# Patient Record
Sex: Female | Born: 2000 | Race: White | Hispanic: No | Marital: Single | Smoking: Never smoker
Health system: Southern US, Community
[De-identification: ages and names within clinical notes are randomized; demographics above are authoritative.]

## PROBLEM LIST (undated history)

## (undated) DIAGNOSIS — R87629 Unspecified abnormal cytological findings in specimens from vagina: Secondary | ICD-10-CM

## (undated) DIAGNOSIS — J45909 Unspecified asthma, uncomplicated: Secondary | ICD-10-CM

## (undated) HISTORY — DX: Unspecified abnormal cytological findings in specimens from vagina: R87.629

## (undated) HISTORY — PX: OTHER SURGICAL HISTORY: SHX169

---
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
## 2018-06-25 ENCOUNTER — Encounter: Payer: Self-pay | Admitting: Nurse Practitioner

## 2018-07-10 ENCOUNTER — Other Ambulatory Visit (HOSPITAL_COMMUNITY)
Admission: RE | Admit: 2018-07-10 | Discharge: 2018-07-10 | Disposition: A | Discharge status: Home / Self Care | Payer: Medicaid Other | Source: Ambulatory Visit | Attending: Family Medicine | Admitting: Family Medicine

## 2018-07-10 ENCOUNTER — Encounter: Payer: Self-pay | Admitting: Family Medicine

## 2018-07-10 ENCOUNTER — Ambulatory Visit (INDEPENDENT_AMBULATORY_CARE_PROVIDER_SITE_OTHER): Payer: Medicaid Other | Admitting: Family Medicine

## 2018-07-10 VITALS — BP 111/68 | HR 65 | Ht 63.0 in | Wt 121.0 lb

## 2018-07-10 DIAGNOSIS — Z113 Encounter for screening for infections with a predominantly sexual mode of transmission: Secondary | ICD-10-CM | POA: Insufficient documentation

## 2018-07-10 DIAGNOSIS — Z308 Encounter for other contraceptive management: Secondary | ICD-10-CM

## 2018-07-10 NOTE — Patient Instructions (Signed)

## 2018-07-10 NOTE — Progress Notes (Signed)
   Subjective:    Patient ID: [name] is a 17 y.o. female presenting with Establish Care  on 07/10/2018  HPI: Here today to discuss contraception options. Currently sexually active and using condoms regularly. Going into the [employer] in January and wants better contraception. LMP 06/26/18. No unprotected intercourse. No recent STD screen.  The following portions of the history were reviewed and updated: PMH, PSH, allergies, meds, FH, SH  Review of Systems  Constitutional: Negative for chills and fever.  Respiratory: Negative for shortness of breath.   Cardiovascular: Negative for chest pain.  Gastrointestinal: Negative for abdominal pain, nausea and vomiting.  Genitourinary: Negative for dysuria.  Skin: Negative for rash.      Objective:    BP 111/68   Pulse 65   Ht 5\' 3"  (1.6 m)   Wt 121 lb (54.9 kg)   LMP 06/30/2018 (Approximate)   BMI 21.43 kg/m  Physical Exam  Constitutional: She is oriented to person, place, and time. She appears well-developed and well-nourished. No distress.  HENT:  Head: Normocephalic and atraumatic.  Eyes: No scleral icterus.  Neck: Neck supple.  Cardiovascular: Normal rate.  Pulmonary/Chest: Effort normal.  Abdominal: Soft.  Neurological: She is alert and oriented to person, place, and time.  Skin: Skin is warm and dry.  Psychiatric: She has a normal mood and affect.        Assessment & Plan:  Screen for STD (sexually transmitted disease) - Plan: Cervicovaginal ancillary only  Encounter for other contraceptive management - reviewed R/B/I of all including Depo, OC's, Nexplanon and IUD both prog and copper. She desires Kyleena--will check GC/Chlam, return with cycle, take Ibuprofen   Total face-to-face time with patient: 20 minutes. Over 50% of encounter was spent on counseling and coordination of care. Return in about 2 weeks (around 07/24/2018) for [facility] IUD insertion.  Donnamae Jude 07/10/2018 2:32 PM

## 2018-07-11 LAB — CERVICOVAGINAL ANCILLARY ONLY
CHLAMYDIA, DNA PROBE: NEGATIVE
Neisseria Gonorrhea: NEGATIVE

## 2018-08-05 ENCOUNTER — Other Ambulatory Visit (HOSPITAL_COMMUNITY)
Admission: RE | Admit: 2018-08-05 | Discharge: 2018-08-05 | Disposition: A | Discharge status: Home / Self Care | Payer: Medicaid Other | Source: Ambulatory Visit

## 2018-08-05 ENCOUNTER — Ambulatory Visit (INDEPENDENT_AMBULATORY_CARE_PROVIDER_SITE_OTHER): Payer: Medicaid Other

## 2018-08-05 VITALS — BP 111/71 | HR 60 | Ht 63.0 in | Wt 117.6 lb

## 2018-08-05 DIAGNOSIS — Z3202 Encounter for pregnancy test, result negative: Secondary | ICD-10-CM | POA: Diagnosis not present

## 2018-08-05 DIAGNOSIS — Z975 Presence of (intrauterine) contraceptive device: Secondary | ICD-10-CM

## 2018-08-05 DIAGNOSIS — Z30430 Encounter for insertion of intrauterine contraceptive device: Secondary | ICD-10-CM | POA: Diagnosis not present

## 2018-08-05 LAB — POCT URINE PREGNANCY: PREG TEST UR: NEGATIVE

## 2018-08-05 MED ORDER — LEVONORGESTREL 19.5 MG IU IUD
1.0000 | INTRAUTERINE_SYSTEM | Freq: Once | INTRAUTERINE | Status: AC
  Administered 2018-08-05: 19.5 mg via INTRAUTERINE

## 2018-08-05 NOTE — Progress Notes (Signed)
    GYNECOLOGY OFFICE PROCEDURE NOTE  [name] is a 18 y.o. G0P0000 here for [place] IUD insertion. No GYN concerns.  LMP 8/28 and no intercourse in last 14 days.   IUD Insertion Procedure Note Patient identified, informed consent performed, consent signed.   Discussed risks of irregular bleeding, cramping, infection, malpositioning or misplacement of the IUD outside the uterus which may require further procedure such as laparoscopy. Time out was performed.  Urine pregnancy test negative.  Speculum placed in the vagina.  Cervix visualized.  Cleaned with Betadine x 2.  Grasped anteriorly with a single tooth tenaculum.  Uterus sounded to 7 cm.  Liletta IUD placed per manufacturer's recommendations.  Strings trimmed to 3 cm. Tenaculum was removed, good hemostasis noted.  Patient tolerated procedure well.   When patient sat up to get dressed, patient felt dizzy and lightheaded. Patient laid back down in bed and given water and orange juice to drink. Patient reported feeling better and was able to get dressed unassisted and ambulate on her own. Suspected vagal response after IUD insertion.  Patient was given post-procedure instructions.  She was advised to have backup contraception for one week.  Patient was also asked to check IUD strings periodically and follow up in 4 weeks for IUD check.  Wende Mott, CNM 08/05/18 2:24 PM

## 2018-08-05 NOTE — Progress Notes (Signed)
RGYN presents for IUD Insertion.  LMP: 07/24/2018  Pt denies any unprotected intercourse x 14 days.   GC/CT per notes due today.   UPT: NEGATIVE

## 2018-08-06 LAB — CERVICOVAGINAL ANCILLARY ONLY
Chlamydia: NEGATIVE
Neisseria Gonorrhea: NEGATIVE
Trichomonas: NEGATIVE

## 2018-08-29 ENCOUNTER — Ambulatory Visit: Payer: Medicaid Other | Admitting: Certified Nurse Midwife

## 2018-08-29 ENCOUNTER — Telehealth: Payer: Self-pay | Admitting: Certified Nurse Midwife

## 2018-08-29 NOTE — Telephone Encounter (Signed)
Pt called back for appt. Gave pt appt at 4:20 today with Darrol Poke.

## 2018-08-29 NOTE — Telephone Encounter (Signed)
Called pt she declined appt today at 4:20pm. Pt states she will not have transportation at that time. Pt advised per triage in office to go to [facility] if symptoms worsen or do not resolve.

## 2018-08-29 NOTE — Telephone Encounter (Signed)
Pain and cramps worsening since IUD placement. Pt has appt Monday 09/02/18 but wants seen today if possible. She has called out of work due to pain. Call mother.

## 2018-09-02 ENCOUNTER — Ambulatory Visit (INDEPENDENT_AMBULATORY_CARE_PROVIDER_SITE_OTHER): Payer: Medicaid Other | Admitting: Advanced Practice Midwife

## 2018-09-02 VITALS — BP 116/74 | HR 119 | Wt 117.0 lb

## 2018-09-02 DIAGNOSIS — Z975 Presence of (intrauterine) contraceptive device: Secondary | ICD-10-CM | POA: Diagnosis not present

## 2018-09-02 DIAGNOSIS — N921 Excessive and frequent menstruation with irregular cycle: Secondary | ICD-10-CM

## 2018-09-02 DIAGNOSIS — Z30431 Encounter for routine checking of intrauterine contraceptive device: Secondary | ICD-10-CM | POA: Diagnosis not present

## 2018-09-02 MED ORDER — NORGESTIMATE-ETH ESTRADIOL 0.25-35 MG-MCG PO TABS: 1.0000 | ORAL_TABLET | Freq: Every day | ORAL | 3 refills | Status: DC

## 2018-09-02 NOTE — Patient Instructions (Signed)

## 2018-09-02 NOTE — Progress Notes (Signed)
  GYNECOLOGY CLINIC PROGRESS NOTE  History:  17 y.o. G0P0000 here today for today for IUD string check; Liletta IUD was placed  08/05/18. Pt with almost daily spotting since insertion, associated with mild cramping.   The following portions of the patient's history were reviewed and updated as appropriate: allergies, current medications, past family history, past medical history, past social history, past surgical history and problem list.   Review of Systems:  Pertinent items are noted in HPI.   Objective:  Physical Exam Blood pressure 116/74, pulse (!) 119, weight 53.1 kg. Gen: NAD Abd: Soft, nontender and nondistended Pelvic: Normal appearing external genitalia; normal appearing vaginal mucosa and cervix.  IUD strings visualized, about 4 cm in length outside cervix.   Assessment & Plan:  1. IUD check up --Doing well, IUD in place without complication. --Light, frequent bleeding reported by pt  2. Breakthrough bleeding associated with intrauterine device (IUD) --Discussed options, including expectant management as this will likely improve, use of OCPs, and removal of IUD and new contraceptive method.  Pt desires OCPs today.  Low-risk, personal or family hx DVT/PE.  Pt given warning signs/reasons to seek care. --F/U in 2 months in office - norgestimate-ethinyl estradiol (ORTHO-CYCLEN,SPRINTEC,PREVIFEM) 0.25-35 MG-MCG tablet; Take 1 tablet by mouth daily.  Dispense: 1 Package; Refill: 3  Patient to keep IUD in place for five years; can come in for removal if she desires pregnancy within the next five years. Routine preventative health maintenance measures emphasized.  Fatima Blank, CNM 2:36 PM

## 2018-09-24 ENCOUNTER — Telehealth: Payer: Self-pay | Admitting: Licensed Clinical Social Worker

## 2018-09-24 NOTE — Telephone Encounter (Signed)
Voicemail reminder of appt

## 2018-09-26 ENCOUNTER — Encounter: Payer: Self-pay | Admitting: Certified Nurse Midwife

## 2018-09-26 ENCOUNTER — Ambulatory Visit (INDEPENDENT_AMBULATORY_CARE_PROVIDER_SITE_OTHER): Payer: Medicaid Other | Admitting: Certified Nurse Midwife

## 2018-09-26 VITALS — BP 125/72 | HR 97 | Wt 119.0 lb

## 2018-09-26 DIAGNOSIS — Z30432 Encounter for removal of intrauterine contraceptive device: Secondary | ICD-10-CM | POA: Diagnosis not present

## 2018-09-26 DIAGNOSIS — Z30013 Encounter for initial prescription of injectable contraceptive: Secondary | ICD-10-CM | POA: Diagnosis not present

## 2018-09-26 MED ORDER — MEDROXYPROGESTERONE ACETATE 150 MG/ML IM SUSP
150.0000 mg | Freq: Once | INTRAMUSCULAR | Status: AC
Start: 2018-09-26 — End: 2018-09-26
  Administered 2018-09-26: 150 mg via INTRAMUSCULAR

## 2018-09-26 MED ORDER — MEDROXYPROGESTERONE ACETATE 150 MG/ML IM SUSP: 150.0000 mg | INTRAMUSCULAR | 0 refills | Status: DC

## 2018-09-26 NOTE — Patient Instructions (Signed)
Hormonal Contraception Information °Hormonal contraception is a type of birth control that uses hormones to prevent pregnancy. It usually involves a combination of the hormones estrogen and progesterone or only the hormone progesterone. Hormonal contraception works in these ways: °· It thickens the mucus in the cervix, making it harder for sperm to enter the uterus. °· It changes the lining of the uterus, making it harder for an egg to implant. °· It may stop the ovaries from releasing eggs (ovulation). Some women who take hormonal contraceptives that contain only progesterone may continue to ovulate. ° °Hormonal contraception cannot prevent sexually transmitted infections (STIs). Pregnancy may still occur. °Estrogen and progesterone contraceptives °Contraceptives that use a combination of estrogen and progesterone are available in these forms: °· Pill. Pills come in different combinations of hormones. They must be taken at the same time each day. Pills can affect your period, causing you to get your period once every three months or not at all. °· Patch. The patch must be worn on the lower abdomen for three weeks and then removed on the fourth. °· Vaginal ring. The ring is placed in the vagina and left there for three weeks. It is then removed for one week. ° °Progesterone contraceptives °Contraceptives that use progesterone only are available in these forms: °· Pill. Pills should be taken every day of the cycle. °· Intrauterine device (IUD). This device is inserted into the uterus and removed or replaced every five years or sooner. °· Implant. Plastic rods are placed under the skin of the upper arm. They are removed or replaced every three years or sooner. °· Injection. The injection is given once every 90 days. ° °What are the side effects? °The side effects of estrogen and progesterone contraceptives include: °· Nausea. °· Headaches. °· Breast tenderness. °· Bleeding or spotting between menstrual cycles. °· High  blood pressure (rare). °· Strokes, heart attacks, or blood clots (rare) ° °Side effects of progesterone-only contraceptives include: °· Nausea. °· Headaches. °· Breast tenderness. °· Unpredictable menstrual bleeding. °· High blood pressure (rare). ° °Talk to your health care provider about what side effects may affect you. °Where to find more information: °· Ask your health care provider for more information and resources about hormonal contraception. °· U.S. Department of Health and Human Services Office on Women's Health: www.womenshealth.gov °Questions to ask: °· What type of hormonal contraception is right for me? °· How long should I plan to use hormonal contraception? °· What are the side effects of the hormonal contraception method I choose? °· How can I prevent STIs while using hormonal contraception? °Contact a health care provider if: °· You start taking hormonal contraceptives and you develop persistent or severe side effects. °Summary °· Estrogen and progesterone are hormones used in many forms of birth control. °· Talk to your health care provider about what side effects may affect you. °· Hormonal contraception cannot prevent sexually transmitted infections (STIs). °· Ask your health care provider for more information and resources about hormonal contraception. °This information is not intended to replace advice given to you by your health care provider. Make sure you discuss any questions you have with your health care provider. °Document Released: 12/03/2007 Document Revised: 10/13/2016 Document Reviewed: 10/13/2016 °Elsevier Interactive Patient Education © 2018 Elsevier Inc. ° °

## 2018-09-26 NOTE — Progress Notes (Signed)
Administered depo and pt tolerated well .Marland Kitchen Administrations This Visit    medroxyPROGESTERone (DEPO-PROVERA) injection 150 mg    Admin Date 09/26/2018 Action Given Dose 150 mg Route Intramuscular Administered By Hinton Lovely, RN        Next depo due [phone]

## 2018-09-26 NOTE — Progress Notes (Signed)
    GYNECOLOGY CLINIC PROCEDURE NOTE  Ms. [name] is a 17 y.o. G0P0000 here for [facility] IUD removal. No GYN concerns.  She has never had a pap smear, age <62yo.   IUD Removal  Patient was in the dorsal lithotomy position, normal external genitalia was noted.  A speculum was placed in the patient's vagina, normal discharge was noted, no lesions. The cervix was visualized, no lesions, no abnormal discharge.  The strings of the IUD were grasped and pulled using ring forceps. The IUD was removed in its entirety.  Patient tolerated the procedure well.    Patient will use Depo for contraception.  Routine preventative health maintenance measures emphasized. Initiation of Depo today. Depo given in office.  Follow up in 3 months for next Depo injection   Lajean Manes, CNM 09/26/2018 11:22 AM

## 2018-11-06 ENCOUNTER — Telehealth: Payer: Self-pay | Admitting: Licensed Clinical Social Worker

## 2018-11-06 NOTE — Telephone Encounter (Signed)
Left message regarding scheduled visit

## 2018-11-11 ENCOUNTER — Encounter: Payer: Self-pay | Admitting: Family Medicine

## 2018-11-11 ENCOUNTER — Ambulatory Visit: Payer: Self-pay | Admitting: Family Medicine

## 2018-11-11 ENCOUNTER — Other Ambulatory Visit (HOSPITAL_COMMUNITY)
Admission: RE | Admit: 2018-11-11 | Discharge: 2018-11-11 | Disposition: A | Discharge status: Home / Self Care | Payer: Medicaid Other | Source: Ambulatory Visit | Attending: Family Medicine | Admitting: Family Medicine

## 2018-11-11 VITALS — BP 118/77 | HR 101 | Wt 119.4 lb

## 2018-11-11 DIAGNOSIS — Z113 Encounter for screening for infections with a predominantly sexual mode of transmission: Secondary | ICD-10-CM | POA: Diagnosis present

## 2018-11-11 DIAGNOSIS — N939 Abnormal uterine and vaginal bleeding, unspecified: Secondary | ICD-10-CM

## 2018-11-11 DIAGNOSIS — Z202 Contact with and (suspected) exposure to infections with a predominantly sexual mode of transmission: Secondary | ICD-10-CM

## 2018-11-11 MED ORDER — AZITHROMYCIN 250 MG PO TABS: 1000.0000 mg | ORAL_TABLET | Freq: Once | ORAL | 0 refills | Status: AC

## 2018-11-11 NOTE — Patient Instructions (Signed)
Try ibuprofen 600mg  (3 tablets) three times a day for 3-5 days to help with abnormal bleeding

## 2018-11-11 NOTE — Progress Notes (Signed)
   GYNECOLOGY OFFICE VISIT NOTE History:  17 y.o. G0P0000 here today for STI testing and abnormal bleeding.   STI Testing - partner had exposure - no symptoms, cramping, dysuria   Abnormal Bleeding - IUD placed 08/05/18, abnormal bleeding into October so tried birth control pills - IUD removed 09/26/18, switched to Depo  - still bleeding, about 5 super tampons per day initially, now down to 2 since starting depo - no improvement with OCPs - cramping was pain problem with IUD - bleeding continues to slow, will come/go  - periods previously normal, monthly lasting about 7 days   The following portions of the patient's history were reviewed and updated as appropriate: allergies, current medications, past family history, past medical history, past social history, past surgical history and problem list.   Review of Systems:  Pertinent items noted in HPI Review of Systems  Constitutional: Negative for chills, fever and malaise/fatigue.  Gastrointestinal: Negative for abdominal pain.  Genitourinary: Negative for dysuria, flank pain and frequency.  Skin: Negative for rash.  Neurological: Negative for dizziness and headaches.   Objective:  Physical Exam BP 118/77   Pulse (!) 101   Wt 119 lb 6.4 oz (54.2 kg)   BMI 21.15 kg/m  Physical Exam  Constitutional: She is oriented to person, place, and time. She appears well-developed and well-nourished. No distress.  HENT:  Head: Normocephalic and atraumatic.  Eyes: Conjunctivae and EOM are normal.  Cardiovascular: Normal rate.  Pulmonary/Chest: No respiratory distress.  Abdominal: There is no abdominal tenderness.  Genitourinary:    Vagina and uterus normal.     Genitourinary Comments: Normal appearing labia, all hair removed, no external lesions noted  Blood pooling in vault, normal appearing cervix without CMT    Neurological: She is alert and oriented to person, place, and time.  Psychiatric: She has a normal mood and affect. Her  behavior is normal.  Nursing note and vitals reviewed.  Labs and Imaging No results found for this or any previous visit (from the past 168 hour(s)). No results found.  Assessment & Plan:  1. Routine screening for STI (sexually transmitted infection) - Hepatitis B surface antigen - Hepatitis C antibody - HIV Antibody (routine testing w rflx) - RPR - Cervicovaginal ancillary only( [place]) - G/C, Trichomonas   2. Exposure to Chlamydia: Partner currently being treated, he had another sexual partner and tested positive.  -- azithromycin 1000mg  once    3. Abnormal Uterine Bleeding: Normal periods prior to Liletta placement. First depo shot about two months ago. Still with some irregular bleeding but certainly lessened. No additional cramping.  -- continue depo shot -- counseled to return if no decrease in bleeding prior to 3rd injection -- counseled on alternative methods of birth control -- counseled on use of ibuprofen to help with abnormal bleeding   Routine preventative health maintenance measures emphasized. Please refer to After Visit Summary for other counseling recommendations.   Return in about 1 month (around 12/12/2018) for schedule next depo appt .  Lambert Mody. Juleen China, DO OB Family Medicine Fellow, [facility] for [employer], [place]

## 2018-11-11 NOTE — Progress Notes (Signed)
Pt presents to be tested for all STD's. Partner cheated and he told her he caught Chlamydia. Pt denies sx's.  Pt switched from IUD to Depo 2 mos ago d/t prolonged periods. Pt is still bleeding.

## 2018-11-12 LAB — HIV ANTIBODY (ROUTINE TESTING W REFLEX): HIV SCREEN 4TH GENERATION: NONREACTIVE

## 2018-11-12 LAB — RPR: RPR Ser Ql: NONREACTIVE

## 2018-11-12 LAB — HEPATITIS C ANTIBODY: Hep C Virus Ab: <0.1 s/co ratio (ref 0.0–0.9)

## 2018-11-12 LAB — HEPATITIS B SURFACE ANTIGEN: HEP B S AG: NEGATIVE

## 2018-11-12 LAB — CERVICOVAGINAL ANCILLARY ONLY
Chlamydia: POSITIVE — AB
Neisseria Gonorrhea: NEGATIVE
TRICH (WINDOWPATH): NEGATIVE

## 2018-12-02 ENCOUNTER — Telehealth: Payer: Self-pay | Admitting: General Practice

## 2018-12-02 NOTE — Telephone Encounter (Signed)
-----   Message from Glenice Bow, DO sent at 12/01/2018  1:36 AM EST ----- Regarding: f/u sti testing Hello Team!  Could you please try to follow-up with this patient? I sent her a MyChart message and sent in an Rx for azithromycin, but she has not yet read that message.   Thanks! [place]

## 2018-12-02 NOTE — Telephone Encounter (Signed)
Called patient and she states she already knows about her results as she saw them in [place], she just didn't click the message. Per chart review, patient was treated in office prophylactically. Patient had no questions.

## 2018-12-26 ENCOUNTER — Other Ambulatory Visit: Payer: Self-pay

## 2018-12-26 MED ORDER — MEDROXYPROGESTERONE ACETATE 150 MG/ML IM SUSP: 150.0000 mg | INTRAMUSCULAR | 3 refills | Status: DC

## 2018-12-27 ENCOUNTER — Ambulatory Visit (INDEPENDENT_AMBULATORY_CARE_PROVIDER_SITE_OTHER): Payer: BLUE CROSS/BLUE SHIELD

## 2018-12-27 DIAGNOSIS — Z3202 Encounter for pregnancy test, result negative: Secondary | ICD-10-CM | POA: Diagnosis not present

## 2018-12-27 DIAGNOSIS — Z3042 Encounter for surveillance of injectable contraceptive: Secondary | ICD-10-CM | POA: Diagnosis not present

## 2018-12-27 LAB — POCT URINE PREGNANCY: PREG TEST UR: NEGATIVE

## 2018-12-27 MED ORDER — MEDROXYPROGESTERONE ACETATE 150 MG/ML IM SUSP
150.0000 mg | Freq: Once | INTRAMUSCULAR | Status: AC
  Administered 2018-12-27: 150 mg via INTRAMUSCULAR

## 2018-12-27 NOTE — Progress Notes (Signed)
Pt is here for depo injection. Pt is one day past her depo window, UPT obtained prior to injection. UPT negative. Injection given in L arm, pt tolerated well. Pt asked if she could get someone else to inject her next depo's or could she do it herself since this is a 45 minute drive for her. I advised pt this would be fine and instructed her how to do it and gave her the depo calendar to go by. Pt verbalized understanding.

## 2019-03-18 ENCOUNTER — Ambulatory Visit (INDEPENDENT_AMBULATORY_CARE_PROVIDER_SITE_OTHER): Payer: BLUE CROSS/BLUE SHIELD

## 2019-03-18 ENCOUNTER — Other Ambulatory Visit: Payer: Self-pay

## 2019-03-18 VITALS — BP 97/61 | HR 65 | Ht 63.0 in | Wt 119.2 lb

## 2019-03-18 DIAGNOSIS — Z3042 Encounter for surveillance of injectable contraceptive: Secondary | ICD-10-CM

## 2019-03-18 MED ORDER — MEDROXYPROGESTERONE ACETATE 150 MG/ML IM SUSP
150.0000 mg | Freq: Once | INTRAMUSCULAR | Status: AC
  Administered 2019-03-18: 150 mg via INTRAMUSCULAR

## 2019-03-18 NOTE — Progress Notes (Signed)
Presents for DEPO, given in RD, tolerated well.  Next DEPO July 7-21/2020  Administrations This Visit    medroxyPROGESTERone (DEPO-PROVERA) injection 150 mg    Admin Date 03/18/2019 Action Given Dose 150 mg Route Intramuscular Administered By Tamela Oddi, RMA

## 2019-06-09 ENCOUNTER — Other Ambulatory Visit: Payer: Self-pay

## 2019-06-09 ENCOUNTER — Ambulatory Visit (INDEPENDENT_AMBULATORY_CARE_PROVIDER_SITE_OTHER): Payer: BC Managed Care – PPO

## 2019-06-09 VITALS — BP 105/72 | HR 86 | Ht 63.0 in | Wt 117.0 lb

## 2019-06-09 DIAGNOSIS — Z3042 Encounter for surveillance of injectable contraceptive: Secondary | ICD-10-CM | POA: Diagnosis not present

## 2019-06-09 MED ORDER — MEDROXYPROGESTERONE ACETATE 150 MG/ML IM SUSP
150.0000 mg | Freq: Once | INTRAMUSCULAR | Status: AC
  Administered 2019-06-09: 150 mg via INTRAMUSCULAR

## 2019-06-09 NOTE — Progress Notes (Signed)
Presented for DEPO, given in LD, tolerated well.  Next DEPO Sept. 28 - Oct. 10/2019 and AEX.  Administrations This Visit    medroxyPROGESTERone (DEPO-PROVERA) injection 150 mg    Admin Date 06/09/2019 Action Given Dose 150 mg Route Intramuscular Administered By Tamela Oddi, RMA

## 2019-08-18 ENCOUNTER — Other Ambulatory Visit: Payer: Self-pay

## 2019-08-18 ENCOUNTER — Telehealth: Payer: Self-pay

## 2019-08-18 DIAGNOSIS — N898 Other specified noninflammatory disorders of vagina: Secondary | ICD-10-CM

## 2019-08-18 MED ORDER — FLUCONAZOLE 150 MG PO TABS: 150.0000 mg | ORAL_TABLET | Freq: Once | ORAL | 0 refills | Status: AC

## 2019-08-18 NOTE — Telephone Encounter (Signed)
Return call to pt regarding possible yeast infection Pt was not ava left detailed message @ 1:49pm for pt to return call to office regarding concerns.

## 2019-08-18 NOTE — Telephone Encounter (Signed)
Pt c/o of possible yeast infection Pt states she has never had a yeast infection Pt notes discharge and itching Diflucan sent Pt made aware if no relief in 7-10 days  to contact the office for a vaginal swab.  Pt agreeable.

## 2019-08-18 NOTE — Progress Notes (Signed)
Rx sent per protocol for yeast Sx's.  Pt made aware to make appt for vaginal swab if no relief after 7-10 days.

## 2019-08-27 ENCOUNTER — Encounter: Payer: Self-pay | Admitting: Obstetrics and Gynecology

## 2019-08-27 ENCOUNTER — Other Ambulatory Visit: Payer: Self-pay

## 2019-08-27 ENCOUNTER — Ambulatory Visit (INDEPENDENT_AMBULATORY_CARE_PROVIDER_SITE_OTHER): Payer: BC Managed Care – PPO | Admitting: Obstetrics and Gynecology

## 2019-08-27 VITALS — BP 107/67 | HR 81 | Ht 63.0 in | Wt 121.0 lb

## 2019-08-27 DIAGNOSIS — Z01419 Encounter for gynecological examination (general) (routine) without abnormal findings: Secondary | ICD-10-CM

## 2019-08-27 DIAGNOSIS — Z3042 Encounter for surveillance of injectable contraceptive: Secondary | ICD-10-CM | POA: Diagnosis not present

## 2019-08-27 MED ORDER — MEDROXYPROGESTERONE ACETATE 150 MG/ML IM SUSP
150.0000 mg | Freq: Once | INTRAMUSCULAR | Status: AC
  Administered 2019-08-27: 09:00:00 150 mg via INTRAMUSCULAR

## 2019-08-27 MED ORDER — MEDROXYPROGESTERONE ACETATE 150 MG/ML IM SUSP: 150.0000 mg | INTRAMUSCULAR | 4 refills | Status: DC

## 2019-08-27 NOTE — Progress Notes (Signed)
Patient Presents for Annual Exam today.    Contraception: Depo  Last Depo 06/09/2019 LD Next injection Due today.  STD Screening: Declines    CC: Recently treated for a vaginal yeast infection.  Pt has no complaints today.

## 2019-08-27 NOTE — Patient Instructions (Signed)
Use the following websites (and others) to help learn more about your contraception options and find the method that is right for you!  - The Centers for Disease Control (CDC) website: https://www.cdc.gov/reproductivehealth/contraception/index.htm  - [facility] website: https://www.plannedparenthood.org/learn/birth-control  - Bedsider.org: https://www.bedsider.org/methods  

## 2019-08-27 NOTE — Progress Notes (Signed)
GYNECOLOGY ANNUAL PREVENTATIVE CARE ENCOUNTER NOTE  Subjective:   [name] is a 18 y.o. G0P0000 female here for a annual gynecologic exam. Current complaints: none. Irregular periods with depo, on depo for about a year and happy with it.  Denies abnormal vaginal bleeding, discharge, pelvic pain, problems with intercourse or other gynecologic concerns.    Gynecologic History No LMP recorded. Patient has had an injection. Contraception: Depo-Provera injections Last Pap: n/a Gardisil: thinks she has received  Obstetric History OB History  Gravida Para Term Preterm AB Living  0 0 0 0 0 0  SAB TAB Ectopic Multiple Live Births  0 0 0 0 0    Past Medical History:  Diagnosis Date  . Asthma     Past Surgical History:  Procedure Laterality Date  . 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   No current facility-administered medications on file prior to visit.    No Known Allergies  Social History   Socioeconomic History  . Marital status: Single    Spouse name: Not on file  . Number of children: Not on file  . Years of education: Not on file  . Highest education level: Not on file  Occupational History  . Not on file  Social Needs  . Financial resource strain: Not on file  . Food insecurity    Worry: Not on file    Inability: Not on file  . Transportation needs    Medical: Not on file    Non-medical: Not on file  Tobacco Use  . Smoking status: Never Smoker  . Smokeless tobacco: Never Used  Substance and Sexual Activity  . Alcohol use: No  . Drug use: No  . Sexual activity: Yes    Birth control/protection: Condom  Lifestyle  . Physical activity    Days per week: Not on file    Minutes per session: Not on file  . Stress: Not on file  Relationships  . Social Herbalist on phone: Not on file    Gets together: Not  on file    Attends religious service: Not on file    Active member of club or organization: Not on file    Attends meetings of clubs or organizations: Not on file    Relationship status: Not on file  . Intimate partner violence    Fear of current or ex partner: Not on file    Emotionally abused: Not on file    Physically abused: Not on file    Forced sexual activity: Not on file  Other Topics Concern  . Not on file  Social History Narrative  . Not on file    Family History  Problem Relation Age of Onset  . Cancer Maternal Grandmother        lung    Diet: eats well Exercise: "all that time"  The following portions of the patient's history were reviewed and updated as appropriate: allergies, current medications, past family history, past medical history, past social history, past surgical history and problem list.  Review of Systems Pertinent items are noted in HPI.   Objective:  BP 107/67   Pulse 81   Ht 5\' 3"  (1.6 m)   Wt 121 lb (54.9 kg)   BMI 21.43 kg/m  CONSTITUTIONAL: Well-developed, well-nourished female in no  acute distress.  HENT:  Normocephalic, atraumatic, External right and left ear normal. Oropharynx is clear and moist EYES: Conjunctivae and EOM are normal. Pupils are equal, round, and reactive to light. No scleral icterus.  NECK: Normal range of motion, supple, no masses.  Normal thyroid.  SKIN: Skin is warm and dry. No rash noted. Not diaphoretic. No erythema. No pallor. NEUROLOGIC: Alert and oriented to person, place, and time. Normal reflexes, muscle tone coordination. No cranial nerve deficit noted. PSYCHIATRIC: Normal mood and affect. Normal behavior. Normal judgment and thought content. CARDIOVASCULAR: Normal heart rate noted, regular rhythm RESPIRATORY: Clear to auscultation bilaterally. Effort and breath sounds normal, no problems with respiration noted. BREASTS: Symmetric in size. No masses, skin changes, nipple drainage, or lymphadenopathy. BL  nipple piercings ABDOMEN: Soft, no distention noted.  No tenderness, rebound or guarding.  PELVIC: Normal appearing external genitalia; normal appearing vaginal mucosa and cervix.  No abnormal discharge noted.  Normal uterine size, no other palpable masses, no uterine or adnexal tenderness. MUSCULOSKELETAL: Normal range of motion. No tenderness.  No cyanosis, clubbing, or edema.  2+ distal pulses.  Exam done with chaperone present.  Assessment and Plan:   1. Well woman exam No issues Declines flu Declines STI testing Counseled regarding importance of routine STI testing and flu vaccine  2. Encounter for surveillance of injectable contraceptive Counseled regarding options for contraception, patient happy with depo Gave info for LARCs - medroxyPROGESTERone (DEPO-PROVERA) injection 150 mg   Will follow up results of STI screen and manage accordingly. Encouraged improvement in diet and exercise.  Mammogram n/a Declines flu vaccine today Gardisil n/a  Routine preventative health maintenance measures emphasized. Please refer to After Visit Summary for other counseling recommendations.   Total face-to-face time with patient: 30 minutes. Over 50% of encounter was spent on counseling and coordination of care.   Feliz Beam, M.D. Attending Center for [employer] Fish farm manager)

## 2019-11-17 ENCOUNTER — Other Ambulatory Visit: Payer: Self-pay

## 2019-11-17 ENCOUNTER — Ambulatory Visit (INDEPENDENT_AMBULATORY_CARE_PROVIDER_SITE_OTHER): Payer: BC Managed Care – PPO

## 2019-11-17 DIAGNOSIS — Z3042 Encounter for surveillance of injectable contraceptive: Secondary | ICD-10-CM

## 2019-11-17 MED ORDER — MEDROXYPROGESTERONE ACETATE 150 MG/ML IM SUSP
150.0000 mg | INTRAMUSCULAR | Status: DC
  Administered 2019-11-17 – 2024-09-17 (×10): 150 mg via INTRAMUSCULAR

## 2019-11-17 NOTE — Progress Notes (Signed)
Pt is in the office for depo injection, administered in RD and pt tolerated well. Next due Mar 8-22 .Marland Kitchen Administrations This Visit    medroxyPROGESTERone (DEPO-PROVERA) injection 150 mg    Admin Date 11/17/2019 Action Given Dose 150 mg Route Intramuscular Administered By Hinton Lovely, RN

## 2019-11-17 NOTE — Progress Notes (Signed)
Patient seen and assessed by nursing staff during this encounter. I have reviewed the chart and agree with the documentation and plan.  Mora Bellman, MD 11/17/2019 4:35 PM

## 2020-02-09 ENCOUNTER — Other Ambulatory Visit: Payer: Self-pay

## 2020-02-09 ENCOUNTER — Ambulatory Visit (INDEPENDENT_AMBULATORY_CARE_PROVIDER_SITE_OTHER): Payer: BC Managed Care – PPO | Admitting: *Deleted

## 2020-02-09 DIAGNOSIS — Z3042 Encounter for surveillance of injectable contraceptive: Secondary | ICD-10-CM | POA: Diagnosis not present

## 2020-02-09 NOTE — Progress Notes (Signed)
   Subjective:  Pt in for Depo Provera injection.    Objective: Need for contraception. No unusual complaints.    Assessment: Pt tolerated Depo injection. Depo given Left Deltoid.   Plan:  Next injection due May 31-May 10, 2020.    Derl Barrow, RN

## 2020-05-10 ENCOUNTER — Ambulatory Visit: Payer: BC Managed Care – PPO

## 2020-05-10 ENCOUNTER — Other Ambulatory Visit: Payer: Self-pay

## 2020-05-10 ENCOUNTER — Ambulatory Visit (INDEPENDENT_AMBULATORY_CARE_PROVIDER_SITE_OTHER): Payer: BC Managed Care – PPO

## 2020-05-10 VITALS — BP 99/62 | HR 83 | Ht 63.0 in | Wt 121.0 lb

## 2020-05-10 DIAGNOSIS — Z3042 Encounter for surveillance of injectable contraceptive: Secondary | ICD-10-CM

## 2020-05-10 MED ORDER — MEDROXYPROGESTERONE ACETATE 150 MG/ML IM SUSP
150.0000 mg | Freq: Once | INTRAMUSCULAR | Status: AC
  Administered 2020-05-10: 150 mg via INTRAMUSCULAR

## 2020-05-10 NOTE — Progress Notes (Signed)
GYN presents for DEPO, given in RD, tolerated well.  Next DEPO August 30 - Sept. 13, 2021  Administrations This Visit    medroxyPROGESTERone (DEPO-PROVERA) injection 150 mg    Admin Date 05/10/2020 Action Given Dose 150 mg Route Intramuscular Administered By Tamela Oddi, RMA

## 2020-08-03 ENCOUNTER — Other Ambulatory Visit: Payer: Self-pay

## 2020-08-03 ENCOUNTER — Ambulatory Visit (INDEPENDENT_AMBULATORY_CARE_PROVIDER_SITE_OTHER): Payer: BC Managed Care – PPO

## 2020-08-03 VITALS — Wt 119.0 lb

## 2020-08-03 DIAGNOSIS — Z3042 Encounter for surveillance of injectable contraceptive: Secondary | ICD-10-CM | POA: Diagnosis not present

## 2020-08-03 MED ORDER — MEDROXYPROGESTERONE ACETATE 150 MG/ML IM SUSP
150.0000 mg | Freq: Once | INTRAMUSCULAR | Status: AC
  Administered 2020-08-03: 150 mg via INTRAMUSCULAR

## 2020-08-03 NOTE — Progress Notes (Signed)
SUBJECTIVE GYN presents for DEPO Injection, given in LD, tolerated well.   PLAN Next DEPO Nov.23-Dec. 7, 2021   Administrations This Visit    medroxyPROGESTERone (DEPO-PROVERA) injection 150 mg    Admin Date 08/03/2020 Action Given Dose 150 mg Route Intramuscular Administered By Tamela Oddi, RMA

## 2020-08-03 NOTE — Progress Notes (Signed)
Patient was assessed and managed by nursing staff during this encounter. I have reviewed the chart and agree with the documentation and plan. I have also made any necessary editorial changes.  Verita Schneiders, MD 08/03/2020 2:11 PM

## 2020-10-24 ENCOUNTER — Other Ambulatory Visit: Payer: Self-pay | Admitting: Obstetrics and Gynecology

## 2020-10-25 ENCOUNTER — Other Ambulatory Visit: Payer: Self-pay

## 2020-10-25 MED ORDER — MEDROXYPROGESTERONE ACETATE 150 MG/ML IM SUSP: 150.0000 mg | INTRAMUSCULAR | 0 refills | Status: DC

## 2020-10-25 NOTE — Addendum Note (Signed)
Addended by: Phillip Heal, Vanette Noguchi A on: 10/25/2020 04:02 PM   Modules accepted: Orders

## 2020-10-25 NOTE — Telephone Encounter (Signed)
Ardena has been schedule for an A/E at the end of the Dec.She does not want to go without her depo for that long. Is it ok to send additional refill?

## 2020-10-26 ENCOUNTER — Ambulatory Visit (INDEPENDENT_AMBULATORY_CARE_PROVIDER_SITE_OTHER): Payer: BC Managed Care – PPO | Admitting: *Deleted

## 2020-10-26 ENCOUNTER — Other Ambulatory Visit: Payer: Self-pay

## 2020-10-26 DIAGNOSIS — Z3042 Encounter for surveillance of injectable contraceptive: Secondary | ICD-10-CM | POA: Diagnosis not present

## 2020-10-26 NOTE — Progress Notes (Signed)
   Subjective:  Pt in for Depo Provera injection.    Objective: Need for contraception. No unusual complaints.    Assessment: Pt tolerated Depo injection. Depo given Left Deltoid.  Plan:  Next injection due Feb. 15, 2022-January 25, 2021.    Derl Barrow, RN

## 2020-11-22 ENCOUNTER — Other Ambulatory Visit (HOSPITAL_COMMUNITY)
Admission: RE | Admit: 2020-11-22 | Discharge: 2020-11-22 | Disposition: A | Discharge status: Home / Self Care | Payer: BC Managed Care – PPO | Source: Ambulatory Visit | Attending: Obstetrics and Gynecology | Admitting: Obstetrics and Gynecology

## 2020-11-22 ENCOUNTER — Other Ambulatory Visit: Payer: Self-pay

## 2020-11-22 ENCOUNTER — Ambulatory Visit (INDEPENDENT_AMBULATORY_CARE_PROVIDER_SITE_OTHER): Payer: BC Managed Care – PPO | Admitting: Obstetrics and Gynecology

## 2020-11-22 ENCOUNTER — Encounter: Payer: Self-pay | Admitting: Obstetrics and Gynecology

## 2020-11-22 VITALS — BP 111/61 | HR 69 | Ht 63.0 in | Wt 121.3 lb

## 2020-11-22 DIAGNOSIS — Z01419 Encounter for gynecological examination (general) (routine) without abnormal findings: Secondary | ICD-10-CM | POA: Diagnosis present

## 2020-11-22 DIAGNOSIS — R87612 Low grade squamous intraepithelial lesion on cytologic smear of cervix (LGSIL): Secondary | ICD-10-CM | POA: Diagnosis not present

## 2020-11-22 DIAGNOSIS — Z01411 Encounter for gynecological examination (general) (routine) with abnormal findings: Secondary | ICD-10-CM

## 2020-11-22 DIAGNOSIS — Z3042 Encounter for surveillance of injectable contraceptive: Secondary | ICD-10-CM

## 2020-11-22 MED ORDER — MEDROXYPROGESTERONE ACETATE 150 MG/ML IM SUSP: 150.0000 mg | INTRAMUSCULAR | 5 refills | Status: DC

## 2020-11-22 NOTE — Progress Notes (Signed)
Patient presents for AEX. Patient states that she does not have any concerns. Declines STD testing.

## 2020-11-22 NOTE — Progress Notes (Signed)
  Subjective:     [name] is a 19 y.o. female P0 with oligomenorrhea associated with depo-porvera who is here for a comprehensive physical exam. The patient reports no problems. Patient is sexually active without complaints. She denies pelvic pain or abnormal discharge.   Past Medical History:  Diagnosis Date  . Asthma    Past Surgical History:  Procedure Laterality Date  . 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 Cancer Maternal Grandmother        lung    Social History   Socioeconomic History  . Marital status: Single    Spouse name: Not on file  . Number of children: Not on file  . Years of education: Not on file  . Highest education level: Not on file  Occupational History  . Not on file  Tobacco Use  . Smoking status: Never Smoker  . Smokeless tobacco: Never Used  Vaping Use  . Vaping Use: Never used  Substance and Sexual Activity  . Alcohol use: No  . Drug use: No  . Sexual activity: Yes    Partners: Male    Birth control/protection: Condom, Injection  Other Topics Concern  . Not on file  Social History Narrative  . Not on file   Social Determinants of Health   Financial Resource Strain: Not on file  Food Insecurity: Not on file  Transportation Needs: Not on file  Physical Activity: Not on file  Stress: Not on file  Social Connections: Not on file  Intimate Partner Violence: Not on file   Health Maintenance  Topic Date Due  . Janet [place]  Never done  . CHLAMYDIA SCREENING  11/12/2019  . INFLUENZA VACCINE  Never done  . PAP-Cervical Cytology Screening  Never done  . PAP SMEAR-Modifier  Never done  . Hepatitis C Screening  Completed  . HIV Screening  Completed       Review of Systems Pertinent items noted in HPI and remainder of comprehensive ROS otherwise negative.   Objective:  Blood pressure 111/61, pulse 69,  height 5\' 3"  (1.6 m), weight 121 lb 4.8 oz (55 kg).     GENERAL: Well-developed, well-nourished female in no acute distress.  HEENT: Normocephalic, atraumatic. Sclerae anicteric.  NECK: Supple. Normal thyroid.  LUNGS: Clear to auscultation bilaterally.  HEART: Regular rate and rhythm. BREASTS: Symmetric in size. No palpable masses or lymphadenopathy, skin changes, or nipple drainage. ABDOMEN: Soft, nontender, nondistended. No organomegaly. PELVIC: Normal external female genitalia. Vagina is pink and rugated.  Normal discharge. Normal appearing cervix. Uterus is normal in size.  No adnexal mass or tenderness. EXTREMITIES: No cyanosis, clubbing, or edema, 2+ distal pulses.    Assessment:    Healthy female exam.      Plan:     Pap smear collected Refill on depo-provera provided Patient declined all STD testing Patient will be contacted with abnormal results RTC in 1 year or prn See After Visit Summary for Counseling Recommendations

## 2020-11-24 LAB — CYTOLOGY - PAP

## 2020-11-25 ENCOUNTER — Encounter: Payer: Self-pay | Admitting: Obstetrics & Gynecology

## 2021-01-11 ENCOUNTER — Ambulatory Visit: Payer: BC Managed Care – PPO

## 2021-01-12 ENCOUNTER — Ambulatory Visit (INDEPENDENT_AMBULATORY_CARE_PROVIDER_SITE_OTHER): Payer: BC Managed Care – PPO

## 2021-01-12 ENCOUNTER — Other Ambulatory Visit: Payer: Self-pay

## 2021-01-12 ENCOUNTER — Ambulatory Visit: Payer: BC Managed Care – PPO

## 2021-01-12 VITALS — BP 121/68 | HR 88 | Ht 63.0 in | Wt 125.0 lb

## 2021-01-12 DIAGNOSIS — Z3042 Encounter for surveillance of injectable contraceptive: Secondary | ICD-10-CM

## 2021-01-12 NOTE — Progress Notes (Signed)
Depo Provera 150mg  IM given Right Deltoid. Pt tolerated well, no adverse side effects noted. Pt is up to date on yearly exam and not due again until 10/2021. Next depo injection due May 4-May 18. Appointment made at checkout.

## 2021-04-01 ENCOUNTER — Ambulatory Visit (INDEPENDENT_AMBULATORY_CARE_PROVIDER_SITE_OTHER): Payer: Commercial Managed Care - PPO

## 2021-04-01 ENCOUNTER — Other Ambulatory Visit: Payer: Self-pay

## 2021-04-01 DIAGNOSIS — Z3042 Encounter for surveillance of injectable contraceptive: Secondary | ICD-10-CM | POA: Diagnosis not present

## 2021-04-01 NOTE — Progress Notes (Signed)
Subjective:  Pt in for Depo Provera injection.    Objective: Need for contraception. No unusual complaints.    Assessment:Depo given L Deltoid without difficulty.   Plan: Next Depo scheduled 06/24/21

## 2021-04-04 NOTE — Progress Notes (Signed)
Patient was assessed and managed by nursing staff during this encounter. I have reviewed the chart and agree with the documentation and plan. I have also made any necessary editorial changes.  Griffin Basil, MD 04/04/2021 8:35 AM

## 2021-04-06 ENCOUNTER — Ambulatory Visit: Payer: BC Managed Care – PPO

## 2021-06-24 ENCOUNTER — Ambulatory Visit (INDEPENDENT_AMBULATORY_CARE_PROVIDER_SITE_OTHER): Payer: BC Managed Care – PPO

## 2021-06-24 ENCOUNTER — Other Ambulatory Visit: Payer: Self-pay

## 2021-06-24 VITALS — BP 102/66 | HR 71 | Ht 63.0 in | Wt 123.0 lb

## 2021-06-24 DIAGNOSIS — Z3042 Encounter for surveillance of injectable contraceptive: Secondary | ICD-10-CM | POA: Diagnosis not present

## 2021-06-24 MED ORDER — MEDROXYPROGESTERONE ACETATE 150 MG/ML IM SUSP
150.0000 mg | Freq: Once | INTRAMUSCULAR | Status: AC
  Administered 2021-06-24: 150 mg via INTRAMUSCULAR

## 2021-06-24 NOTE — Progress Notes (Signed)
SUBJECTIVE: [name] is a 20 y.o. female who presents for DEPO Injection.  OBJECTIVE: Appears well, in no apparent distress.  Vital signs are normal.   ASSESSMENT: within window for DEPO. Last PAP 11/22/2020  PLAN: DEPO given in RD, tolerated well. Next DEPO due Oct. 14-28, 2022  Administrations This Visit     medroxyPROGESTERone (DEPO-PROVERA) injection 150 mg     Admin Date 06/24/2021 Action Given Dose 150 mg Route Intramuscular Administered By Tamela Oddi, RMA

## 2021-09-13 ENCOUNTER — Other Ambulatory Visit: Payer: Self-pay

## 2021-09-13 ENCOUNTER — Ambulatory Visit (INDEPENDENT_AMBULATORY_CARE_PROVIDER_SITE_OTHER): Payer: BC Managed Care – PPO

## 2021-09-13 DIAGNOSIS — Z3042 Encounter for surveillance of injectable contraceptive: Secondary | ICD-10-CM

## 2021-09-13 NOTE — Progress Notes (Signed)
Pt is in the office for depo injection, administered in LD and pt tolerated well. Next due Jan 3-17. .. Administrations This Visit     medroxyPROGESTERone (DEPO-PROVERA) injection 150 mg     Admin Date 09/13/2021 Action Given Dose 150 mg Route Intramuscular Administered By Hinton Lovely, RN

## 2021-09-14 ENCOUNTER — Ambulatory Visit: Payer: BC Managed Care – PPO

## 2021-12-04 ENCOUNTER — Other Ambulatory Visit: Payer: Self-pay | Admitting: Obstetrics and Gynecology

## 2021-12-05 ENCOUNTER — Encounter: Payer: Self-pay | Admitting: *Deleted

## 2021-12-06 ENCOUNTER — Ambulatory Visit (INDEPENDENT_AMBULATORY_CARE_PROVIDER_SITE_OTHER): Payer: BC Managed Care – PPO | Admitting: *Deleted

## 2021-12-06 ENCOUNTER — Other Ambulatory Visit: Payer: Self-pay

## 2021-12-06 VITALS — BP 104/73 | HR 91

## 2021-12-06 DIAGNOSIS — Z3042 Encounter for surveillance of injectable contraceptive: Secondary | ICD-10-CM

## 2021-12-06 MED ORDER — MEDROXYPROGESTERONE ACETATE 150 MG/ML IM SUSP
150.0000 mg | Freq: Once | INTRAMUSCULAR | Status: AC
  Administered 2021-12-06: 150 mg via INTRAMUSCULAR

## 2021-12-06 NOTE — Addendum Note (Signed)
Addended by: Penny Pia on: 12/06/2021 08:35 AM   Modules accepted: Orders

## 2021-12-06 NOTE — Progress Notes (Signed)
Date last pap: 11/22/20. Last Depo-Provera: 09/13/21. Side Effects if any: NA. Serum HCG indicated? NA. Depo-Provera 150 mg IM given by: Lillia Abed. Everlyn Farabaugh, RNC in Left Deltoid. Next appointment due 02/21/22-03/07/22.   Overdue annual. Advised to schedule prior to next injection for MD exam and new RX. Patient scheduled at check out.

## 2022-01-17 ENCOUNTER — Other Ambulatory Visit (HOSPITAL_COMMUNITY)
Admission: RE | Admit: 2022-01-17 | Discharge: 2022-01-17 | Disposition: A | Discharge status: Home / Self Care | Payer: BC Managed Care – PPO | Source: Ambulatory Visit | Attending: Obstetrics and Gynecology | Admitting: Obstetrics and Gynecology

## 2022-01-17 ENCOUNTER — Ambulatory Visit (INDEPENDENT_AMBULATORY_CARE_PROVIDER_SITE_OTHER): Payer: BC Managed Care – PPO | Admitting: Obstetrics and Gynecology

## 2022-01-17 ENCOUNTER — Encounter: Payer: Self-pay | Admitting: Obstetrics and Gynecology

## 2022-01-17 ENCOUNTER — Other Ambulatory Visit: Payer: Self-pay

## 2022-01-17 VITALS — BP 112/72 | HR 94 | Ht 63.0 in | Wt 124.0 lb

## 2022-01-17 DIAGNOSIS — Z01419 Encounter for gynecological examination (general) (routine) without abnormal findings: Secondary | ICD-10-CM | POA: Insufficient documentation

## 2022-01-17 DIAGNOSIS — Z3042 Encounter for surveillance of injectable contraceptive: Secondary | ICD-10-CM

## 2022-01-17 MED ORDER — MEDROXYPROGESTERONE ACETATE 150 MG/ML IM SUSP
150.0000 mg | INTRAMUSCULAR | 5 refills | Status: DC
Start: 2022-01-17 — End: 2023-02-13

## 2022-01-17 NOTE — Progress Notes (Signed)
Subjective:     [name] is a 21 y.o. female P38 with amenorrhea secondary to depo-provera and BMI 21 who is here for a comprehensive physical exam. The patient reports no problems. She is sexually active without complaints. She denies pelvic pain or abnormal discharge. Patient is without any complaints. She had an abnormal pap smear in 2021 LGSIL.  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   Family History  Problem Relation Age of Onset   Cancer Maternal Grandmother        lung    Social History   Socioeconomic History   Marital status: Single    Spouse name: Not on file   Number of children: Not on file   Years of education: Not on file   Highest education level: Not on file  Occupational History   Not on file  Tobacco Use   Smoking status: Never   Smokeless tobacco: Never  Vaping Use   Vaping Use: Never used  Substance and Sexual Activity   Alcohol use: No   Drug use: No   Sexual activity: Yes    Partners: Male    Birth control/protection: Condom, Injection  Other Topics Concern   Not on file  Social History Narrative   Not on file   Social Determinants of Health   Financial Resource Strain: Not on file  Food Insecurity: Not on file  Transportation Needs: Not on file  Physical Activity: Not on file  Stress: Not on file  Social Connections: Not on file  Intimate Partner Violence: Not on file   Health Maintenance  Topic Date Due   HPV VACCINES (1 - 2-dose series) Never done   TETANUS/TDAP  Never done   CHLAMYDIA SCREENING  11/12/2019   INFLUENZA VACCINE  Never done   PAP-Cervical Cytology Screening  11/23/2023   PAP SMEAR-Modifier  11/23/2023   Hepatitis C Screening  Completed   HIV Screening  Completed       Review of Systems Pertinent items  noted in HPI and remainder of comprehensive ROS otherwise negative.   Objective:  Blood pressure 112/72, pulse 94, height 5\' 3"  (1.6 m), weight 124 lb (56.2 kg).   GENERAL: Well-developed, well-nourished female in no acute distress.  HEENT: Normocephalic, atraumatic. Sclerae anicteric.  NECK: Supple. Normal thyroid.  LUNGS: Clear to auscultation bilaterally.  HEART: Regular rate and rhythm. BREASTS: Symmetric in size. No palpable masses or lymphadenopathy, skin changes, or nipple drainage. ABDOMEN: Soft, nontender, nondistended. No organomegaly. PELVIC: Normal external female genitalia. Vagina is pink and rugated.  Normal discharge. Normal appearing cervix. Uterus is normal in size. No adnexal mass or tenderness. Chaperone present during the pelvic exam EXTREMITIES: No cyanosis, clubbing, or edema, 2+ distal pulses.     Assessment:    Healthy female exam.      Plan:    Pap smear collected STI screening declined Refill on depo-provera provided Discussed benefits of Gardasil vaccine and patient will enquire if she has already received it Patient will be contacted with abnormal results RTC in 1 year or prn See After Visit Summary for Counseling Recommendations

## 2022-01-17 NOTE — Progress Notes (Signed)
Patient presents for AEX and pap. Patient does not have any concerns today. Denies having any vaginal discharge, odor, or irritation.  Last Pap 11/22/2020 LGSIL

## 2022-01-26 LAB — CYTOLOGY - PAP
Comment: NEGATIVE
Comment: NEGATIVE
Diagnosis: UNDETERMINED — AB
HPV 16: NEGATIVE
HPV 18 / 45: NEGATIVE
High risk HPV: POSITIVE — AB

## 2022-01-30 ENCOUNTER — Telehealth: Payer: Self-pay

## 2022-01-30 NOTE — Telephone Encounter (Signed)
S/w patient about results and need for colpo. Advised scheduler will call for appt. ?

## 2022-02-27 ENCOUNTER — Ambulatory Visit: Payer: BC Managed Care – PPO

## 2022-03-09 ENCOUNTER — Encounter: Payer: Self-pay | Admitting: Obstetrics

## 2022-03-09 ENCOUNTER — Other Ambulatory Visit (HOSPITAL_COMMUNITY)
Admission: RE | Admit: 2022-03-09 | Discharge: 2022-03-09 | Disposition: A | Discharge status: Home / Self Care | Payer: BC Managed Care – PPO | Source: Ambulatory Visit | Attending: Obstetrics | Admitting: Obstetrics

## 2022-03-09 ENCOUNTER — Ambulatory Visit (INDEPENDENT_AMBULATORY_CARE_PROVIDER_SITE_OTHER): Payer: BC Managed Care – PPO | Admitting: Obstetrics

## 2022-03-09 VITALS — BP 101/63 | HR 84 | Ht 63.0 in | Wt 125.2 lb

## 2022-03-09 DIAGNOSIS — R87810 Cervical high risk human papillomavirus (HPV) DNA test positive: Secondary | ICD-10-CM

## 2022-03-09 DIAGNOSIS — R87610 Atypical squamous cells of undetermined significance on cytologic smear of cervix (ASC-US): Secondary | ICD-10-CM

## 2022-03-09 DIAGNOSIS — Z3042 Encounter for surveillance of injectable contraceptive: Secondary | ICD-10-CM

## 2022-03-09 DIAGNOSIS — Z3202 Encounter for pregnancy test, result negative: Secondary | ICD-10-CM | POA: Diagnosis not present

## 2022-03-09 LAB — POCT URINE PREGNANCY: Preg Test, Ur: NEGATIVE

## 2022-03-09 NOTE — Progress Notes (Signed)
Colposcopy Procedure Note ? ?Indications: Pap smear 1 months ago showed: ASCUS with POSITIVE high risk HPV. The prior pap showed low-grade squamous intraepithelial neoplasia (LGSIL - encompassing HPV,mild dysplasia,CIN I).  Prior cervical/vaginal disease: CIN 1. Prior cervical treatment: no treatment. ? ?Procedure Details  ?The risks and benefits of the procedure and Written informed consent obtained.  A time-out was performed confirming the patient, procedure and allergy status ? ?Speculum placed in vagina and excellent visualization of cervix achieved, cervix swabbed x 3 with acetic acid solution. ? ?Findings: ?Cervix: no mosaicism, no punctation, no abnormal vasculature, and acetowhite lesion(s) noted at 12 o'clock; SCJ visualized 360 degrees without lesions, endocervical curettage performed, cervical biopsies taken at 6 and 12 o'clock, specimen labelled and sent to pathology, and hemostasis achieved with Monsel's solution.   ?Vaginal inspection: normal without visible lesions. ?Vulvar colposcopy: vulvar colposcopy not performed. ?  ?Physical Exam ?  ?Specimens: ECC and Cervical Biopsies ? ?Complications: none. ? ?Plan: ?Specimens labelled and sent to Pathology. ?Will base further treatment on Pathology findings. ?Treatment options discussed with patient. ?Post biopsy instructions given to patient. ?Return to discuss Pathology results in 2 weeks.  ? ?Shelly Bombard, MD ?03/09/2022 2:21 PM  ?

## 2022-03-09 NOTE — Progress Notes (Signed)
Pt presents for colposcopy and depo.  ?Depo given RD without difficulty  ?Next Depo due June 29-July 13, pt agrees.  ?Administrations This Visit   ? ? medroxyPROGESTERone (DEPO-PROVERA) injection 150 mg   ? ? Admin Date ?03/09/2022 Action ?Given Dose ?150 mg Route ?Intramuscular Administered By ?Hollice Gong, CMA  ? ?  ?  ? ?  ?  ?

## 2022-03-13 LAB — SURGICAL PATHOLOGY

## 2022-03-14 ENCOUNTER — Encounter: Payer: Self-pay | Admitting: Emergency Medicine

## 2022-03-23 ENCOUNTER — Encounter: Payer: Self-pay | Admitting: Obstetrics

## 2022-03-23 ENCOUNTER — Telehealth (INDEPENDENT_AMBULATORY_CARE_PROVIDER_SITE_OTHER): Payer: BC Managed Care – PPO | Admitting: Obstetrics

## 2022-03-23 DIAGNOSIS — N870 Mild cervical dysplasia: Secondary | ICD-10-CM

## 2022-03-23 NOTE — Progress Notes (Signed)
? ?  TELEHEALTH GYNECOLOGY VISIT ENCOUNTER NOTE ? ?Provider location: [facility] [place]  ? ?Patient location: Home ? ?I connected with [place] on 03/23/22 at  3:30 PM EDT by telephone and verified that I am speaking with the correct person using two identifiers. Patient was unable to do MyChart audiovisual encounter due to technical difficulties, she tried several times.  ?  ?I discussed the limitations, risks, security and privacy concerns of performing an evaluation and management service by telephone and the availability of in person appointments. I also discussed with the patient that there may be a patient responsible charge related to this service. The patient expressed understanding and agreed to proceed. ?  ?History:  ?[name] is a 21 y.o. G0P0000 female being evaluated today for colposcopy results. She denies any abnormal vaginal discharge, bleeding, pelvic pain or other concerns.   ?  ?  ?Past Medical History:  ?Diagnosis Date  ? Asthma   ? Vaginal Pap smear, abnormal   ? ?Past Surgical History:  ?Procedure Laterality Date  ? chest surgery to remove  growth as child    ? OTHER SURGICAL HISTORY    ? 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 ?The following portions of the patient's history were reviewed and updated as appropriate: allergies, current medications, past family history, past medical history, past social history, past surgical history and problem list.  ? ?Health Maintenance:  ASCUS pap and positive HRHPV on 01-17-2022.   ? ?Review of Systems:  ?Pertinent items noted in HPI and remainder of comprehensive ROS otherwise negative. ? ?Physical Exam:  ? ?General:  Alert, oriented and cooperative.   ?Mental Status: Normal mood and affect perceived. Normal judgment and thought content.  ?Physical exam deferred due to nature of the encounter ? ?Labs and Imaging ?No results found for this or any previous visit (from the past 336 hour(s)). ?No results found.    ?Assessment  and Plan:  ?   ?1. Dysplasia of cervix, low grade (CIN 1) ?- repeat pap in 1 year  ?   ?  ?I discussed the assessment and treatment plan with the patient. The patient was provided an opportunity to ask questions and all were answered. The patient agreed with the plan and demonstrated an understanding of the instructions. ?  ?The patient was advised to call back or seek an in-person evaluation/go to the ED if the symptoms worsen or if the condition fails to improve as anticipated. ? ?I have spent a total of 15 minutes of face-to-face time, excluding clinical staff time, reviewing notes and preparing to see patient, ordering tests and/or medications, and counseling the patient.  ? ? ?Baltazar Najjar, MD ?[facility] [place], [place], [facility] ?03/23/22  ?

## 2022-03-23 NOTE — Progress Notes (Signed)
Virtual Visit via Telephone Note ? ?I connected with [name] on 03/23/22 at  3:30 PM EDT by telephone and verified that I am speaking with the correct person using two identifiers. ? ?Location: ?Patient: home  ?Provider: Merla Riches  ? ?Pt presents to follow up pathology results s/p colposcopy on 03/09/22. ?

## 2022-06-05 ENCOUNTER — Ambulatory Visit (INDEPENDENT_AMBULATORY_CARE_PROVIDER_SITE_OTHER): Payer: BC Managed Care – PPO

## 2022-06-05 VITALS — BP 109/71 | HR 93 | Ht 63.0 in | Wt 124.0 lb

## 2022-06-05 DIAGNOSIS — Z3042 Encounter for surveillance of injectable contraceptive: Secondary | ICD-10-CM

## 2022-06-05 MED ORDER — MEDROXYPROGESTERONE ACETATE 150 MG/ML IM SUSP
150.0000 mg | Freq: Once | INTRAMUSCULAR | Status: AC
  Administered 2022-06-05: 150 mg via INTRAMUSCULAR

## 2022-06-05 NOTE — Progress Notes (Addendum)
SUBJECTIVE: [name] is a 21 y.o. female who presents for DEPO Injection BC.   OBJECTIVE: Appears well, in no apparent distress.  Vital signs are normal.     ASSESSMENT: On time for Depo.  Last PAP 01/17/22 ASCUS.  PLAN: DEPO given in LD, tolerated well. Next DEPO  Sept.25 - Oct. 9, 2023  Administrations This Visit     medroxyPROGESTERone (DEPO-PROVERA) injection 150 mg     Admin Date 06/05/2022 Action Given Dose 150 mg Route Intramuscular Administered By Tamela Oddi, RMA

## 2022-06-20 NOTE — Progress Notes (Signed)
Patient was assessed and managed by nursing staff during this encounter. I have reviewed the chart and agree with the documentation and plan. I have also made any necessary editorial changes.  Emeterio Reeve, MD 06/20/2022 3:02 PM

## 2022-08-28 ENCOUNTER — Ambulatory Visit (INDEPENDENT_AMBULATORY_CARE_PROVIDER_SITE_OTHER): Payer: BC Managed Care – PPO | Admitting: General Practice

## 2022-08-28 DIAGNOSIS — Z3042 Encounter for surveillance of injectable contraceptive: Secondary | ICD-10-CM | POA: Diagnosis not present

## 2022-08-28 MED ORDER — MEDROXYPROGESTERONE ACETATE 150 MG/ML IM SUSP
150.0000 mg | Freq: Once | INTRAMUSCULAR | Status: AC
  Administered 2022-08-28: 150 mg via INTRAMUSCULAR

## 2022-08-28 NOTE — Progress Notes (Signed)
Date last pap: 03-09-22. Last Depo-Provera: 06-05-22. Side Effects if any: Pt tolerated well. Depo-Provera 150 mg IM given by: Arlie Solomons, CMA in left deltoid per pt request. Next appointment due 12/19-1/2.

## 2022-11-23 ENCOUNTER — Ambulatory Visit (INDEPENDENT_AMBULATORY_CARE_PROVIDER_SITE_OTHER): Payer: BC Managed Care – PPO | Admitting: General Practice

## 2022-11-23 VITALS — BP 106/71 | HR 73 | Ht 63.0 in | Wt 128.8 lb

## 2022-11-23 DIAGNOSIS — Z3042 Encounter for surveillance of injectable contraceptive: Secondary | ICD-10-CM | POA: Diagnosis not present

## 2022-11-23 MED ORDER — MEDROXYPROGESTERONE ACETATE 150 MG/ML IM SUSP
150.0000 mg | Freq: Once | INTRAMUSCULAR | Status: AC
  Administered 2022-11-23: 150 mg via INTRAMUSCULAR

## 2022-11-23 NOTE — Progress Notes (Signed)
Date last pap: 03-09-22. Last Depo-Provera: 08-28-22. Side Effects if any: Pt tolerated well. Serum HCG indicated? NA. Pt provided Depo Depo-Provera 150 mg IM given by: Arlie Solomons, CMA in the Left deltoid per pt request. Next appointment due 3/15-3/29.

## 2022-11-23 NOTE — Progress Notes (Signed)
Patient was assessed and managed by nursing staff during this encounter. I have reviewed the chart and agree with the documentation and plan. I have also made any necessary editorial changes.  Griffin Basil, MD 11/23/2022 3:08 PM

## 2023-02-09 ENCOUNTER — Other Ambulatory Visit: Payer: Self-pay | Admitting: Obstetrics and Gynecology

## 2023-02-13 ENCOUNTER — Other Ambulatory Visit: Payer: Self-pay | Admitting: Emergency Medicine

## 2023-02-13 MED ORDER — MEDROXYPROGESTERONE ACETATE 150 MG/ML IM SUSP
150.0000 mg | INTRAMUSCULAR | 0 refills | Status: DC
Start: 2023-02-13 — End: 2023-04-18

## 2023-02-13 NOTE — Progress Notes (Signed)
Rx for Depo 

## 2023-02-16 ENCOUNTER — Ambulatory Visit (INDEPENDENT_AMBULATORY_CARE_PROVIDER_SITE_OTHER): Payer: BC Managed Care – PPO

## 2023-02-16 DIAGNOSIS — Z3042 Encounter for surveillance of injectable contraceptive: Secondary | ICD-10-CM | POA: Diagnosis not present

## 2023-02-16 NOTE — Progress Notes (Addendum)
Date last pap: 01/17/22. Last Depo-Provera: 11/23/22. Side Effects if any: Pt tolerated well. Serum HCG indicated? N/A. Depo-Provera 150 mg IM given by: Delanda Bulluck D. LPN in right deltoid . Next appointment due 6/7-6/21.

## 2023-04-18 ENCOUNTER — Other Ambulatory Visit (HOSPITAL_COMMUNITY)
Admission: RE | Admit: 2023-04-18 | Discharge: 2023-04-18 | Disposition: A | Discharge status: Home / Self Care | Payer: BC Managed Care – PPO | Source: Ambulatory Visit | Attending: Obstetrics and Gynecology | Admitting: Obstetrics and Gynecology

## 2023-04-18 ENCOUNTER — Ambulatory Visit (INDEPENDENT_AMBULATORY_CARE_PROVIDER_SITE_OTHER): Payer: BC Managed Care – PPO | Admitting: Obstetrics and Gynecology

## 2023-04-18 ENCOUNTER — Encounter: Payer: Self-pay | Admitting: Obstetrics and Gynecology

## 2023-04-18 VITALS — BP 113/72 | HR 73 | Ht 63.0 in | Wt 130.6 lb

## 2023-04-18 DIAGNOSIS — R87810 Cervical high risk human papillomavirus (HPV) DNA test positive: Secondary | ICD-10-CM

## 2023-04-18 DIAGNOSIS — Z01419 Encounter for gynecological examination (general) (routine) without abnormal findings: Secondary | ICD-10-CM

## 2023-04-18 DIAGNOSIS — R87610 Atypical squamous cells of undetermined significance on cytologic smear of cervix (ASC-US): Secondary | ICD-10-CM | POA: Insufficient documentation

## 2023-04-18 DIAGNOSIS — Z3049 Encounter for surveillance of other contraceptives: Secondary | ICD-10-CM

## 2023-04-18 MED ORDER — MEDROXYPROGESTERONE ACETATE 150 MG/ML IM SUSP
150.0000 mg | INTRAMUSCULAR | 0 refills | Status: DC
Start: 2023-04-18 — End: 2023-04-18

## 2023-04-18 MED ORDER — MEDROXYPROGESTERONE ACETATE 150 MG/ML IM SUSP
150.0000 mg | INTRAMUSCULAR | 4 refills | Status: DC
Start: 2023-04-18 — End: 2024-06-18

## 2023-04-18 NOTE — Progress Notes (Signed)
Pt presents for AEX. Needs Depo refill, may switch BC. Considering BC pills.  Declines STD testing.

## 2023-04-18 NOTE — Progress Notes (Signed)
WELL-WOMAN PHYSICAL & PAP Patient name: [name] MRN [id number]  Date of birth: Jan 08, 2001 Chief Complaint:   No chief complaint on file.  History of Present Illness:   [name] is a 22 y.o. G0P0000 Caucasian female being seen today for a routine well-woman exam.  Current complaints: none  PCP: none      does not desire labs No LMP recorded. Patient has had an injection. The current method of family planning is Depo-Provera injections.  Last pap 01/17/2022. Results were: abnormal  ASCUS with (+) HRHPV Last mammogram: n/a. Family h/o breast cancer: No Last colonoscopy: n/a. Family h/o colorectal cancer: No Review of Systems:   Pertinent items are noted in HPI Denies any headaches, blurred vision, fatigue, shortness of breath, chest pain, abdominal pain, abnormal vaginal discharge/itching/odor/irritation, problems with periods, bowel movements, urination, or intercourse unless otherwise stated above. Pertinent History Reviewed:  Reviewed past medical,surgical, social and family history.  Reviewed problem list, medications and allergies. Physical Assessment:   Vitals:   04/18/23 1013  BP: 113/72  Pulse: 73  Weight: 130 lb 9.6 oz (59.2 kg)  Height: 5\' 3"  (1.6 m)  Body mass index is 23.13 kg/m.        Physical Examination:   General appearance - well appearing, and in no distress  Mental status - alert, oriented to person, place, and time  Psych:  She has a normal mood and affect  Skin - warm and dry, normal color, no suspicious lesions noted  Chest - effort normal, all lung fields clear to auscultation bilaterally  Heart - normal rate and regular rhythm  Neck:  midline trachea, no thyromegaly or nodules  Breasts - breasts appear normal, no suspicious masses, no skin or nipple changes or  axillary nodes  Abdomen - soft, nontender, nondistended, no masses or organomegaly  Pelvic - VULVA: normal appearing vulva with no masses, tenderness or lesions  VAGINA: normal  appearing vagina with normal color and discharge, no lesions  CERVIX: normal appearing cervix without discharge or lesions, no CMT  Thin prep pap is done with reflex HR HPV cotesting  UTERUS: uterus is felt to be normal size, shape, consistency and nontender   ADNEXA: No adnexal masses or tenderness noted.  Rectal - deferred  Extremities:  No swelling or varicosities noted  No results found for this or any previous visit (from the past 24 hour(s)).  Assessment & Plan:  1) Encounter for well woman exam with routine gynecological exam - Cytology - PAP( [facility])  2) Atypical squamous cell changes of undetermined significance (ASCUS) on cervical cytology with positive high risk human papilloma virus (HPV) - Cytology - PAP( [facility])  3) Encounter for surveillance of other contraceptive - Rx: medroxyPROGESTERone (DEPO-PROVERA) 150 MG/ML injection; Inject 1 mL (150 mg total) into the muscle every 3 (three) months.  Dispense: 1 mL; Refill: 4   Labs/procedures today: pap with reflex HPV  Mammogram at age 54 or sooner if problems Colonoscopy at age 69 or sooner if problems  No orders of the defined types were placed in this encounter.   Meds:  Meds ordered this encounter  Medications   medroxyPROGESTERone (DEPO-PROVERA) 150 MG/ML injection    Sig: Inject 1 mL (150 mg total) into the muscle every 3 (three) months.    Dispense:  1 mL    Refill:  0    Order Specific Question:   Supervising Provider    Answer:   Samara Snide  Follow-up: Return in about 1 year (around 04/17/2024) for Annual Exam.  Raelyn Mora MSN, CNM 04/18/2023 12:29 PM

## 2023-04-23 LAB — CYTOLOGY - PAP

## 2023-04-24 ENCOUNTER — Encounter: Payer: Self-pay | Admitting: Obstetrics and Gynecology

## 2023-05-11 ENCOUNTER — Ambulatory Visit (INDEPENDENT_AMBULATORY_CARE_PROVIDER_SITE_OTHER): Payer: BC Managed Care – PPO

## 2023-05-11 VITALS — BP 108/71 | HR 76 | Wt 130.0 lb

## 2023-05-11 DIAGNOSIS — Z3042 Encounter for surveillance of injectable contraceptive: Secondary | ICD-10-CM | POA: Diagnosis not present

## 2023-05-11 NOTE — Progress Notes (Signed)
Subjective:  Pt in for pt supplied Depo Provera injection.    Objective: Need for contraception. No unusual complaints.    Assessment: Depo given R Del without difficulty. Pt tolerated Depo injection.   Plan:  Next injection scheduled 07/31/23

## 2023-06-13 ENCOUNTER — Other Ambulatory Visit (HOSPITAL_COMMUNITY)
Admission: RE | Admit: 2023-06-13 | Discharge: 2023-06-13 | Disposition: A | Discharge status: Home / Self Care | Payer: BC Managed Care – PPO | Source: Ambulatory Visit | Attending: Obstetrics and Gynecology | Admitting: Obstetrics and Gynecology

## 2023-06-13 ENCOUNTER — Ambulatory Visit (INDEPENDENT_AMBULATORY_CARE_PROVIDER_SITE_OTHER): Payer: BC Managed Care – PPO | Admitting: Obstetrics and Gynecology

## 2023-06-13 ENCOUNTER — Encounter: Payer: Self-pay | Admitting: Obstetrics and Gynecology

## 2023-06-13 VITALS — Ht 63.0 in | Wt 129.3 lb

## 2023-06-13 DIAGNOSIS — Z3202 Encounter for pregnancy test, result negative: Secondary | ICD-10-CM | POA: Diagnosis not present

## 2023-06-13 DIAGNOSIS — N870 Mild cervical dysplasia: Secondary | ICD-10-CM | POA: Diagnosis not present

## 2023-06-13 LAB — POCT URINE PREGNANCY: Preg Test, Ur: NEGATIVE

## 2023-06-13 NOTE — Progress Notes (Signed)
Pt presents for colpo. LSIL noted on 04/18/23 pap

## 2023-06-13 NOTE — Progress Notes (Signed)
Patient given informed consent, signed copy in the chart, time out was performed.  Previous pap and biopsy results.  UPT negative.  Placed in lithotomy position. Cervix viewed with speculum and colposcope after application of acetic acid and lugol's solution.   Colposcopy adequate?    no Acetowhite lesions?none external, no nonstaining areas with lugol's Punctation? none Mosaicism?   none Abnormal vasculature?   none Biopsies? none ECC?  yes  COMMENTS: Patient was given post procedure instructions.    Warden Fillers, MD

## 2023-06-15 LAB — SURGICAL PATHOLOGY

## 2023-06-18 ENCOUNTER — Telehealth: Payer: Self-pay

## 2023-06-18 NOTE — Telephone Encounter (Signed)
I connected with  [name] on 06/18/23 by telephone and verified that I am speaking with the correct person using two identifiers.   Pt informed of negative ECC and will need a repeat pap in 1 year. Pt has no questions or concerns at this time.

## 2023-06-18 NOTE — Telephone Encounter (Signed)
-----   Message from Warden Fillers sent at 06/17/2023 11:29 PM EDT ----- ECC negative for dysplasia, will call pt with negative results, repap in 1 year

## 2023-07-31 ENCOUNTER — Ambulatory Visit (INDEPENDENT_AMBULATORY_CARE_PROVIDER_SITE_OTHER): Payer: BC Managed Care – PPO | Admitting: Emergency Medicine

## 2023-07-31 VITALS — BP 106/67 | HR 68 | Ht 63.0 in | Wt 131.3 lb

## 2023-07-31 DIAGNOSIS — Z3042 Encounter for surveillance of injectable contraceptive: Secondary | ICD-10-CM

## 2023-07-31 MED ORDER — MEDROXYPROGESTERONE ACETATE 150 MG/ML IM SUSP
150.0000 mg | Freq: Once | INTRAMUSCULAR | Status: AC
  Administered 2023-07-31: 150 mg via INTRAMUSCULAR

## 2023-07-31 NOTE — Progress Notes (Signed)
Date last pap: 04/18/2023.  Last Depo-Provera: 05/11/2023.  Side Effects if any: Reports vaginal bleeding for 1 month. States that she has never had prolonged bleeding like this before. Denies heavy bleeding or abdominal pain/cramping.  Serum HCG indicated? NA.  Depo-Provera 150 mg IM given by: Resa Miner, RN into LD, tolerated well in office.  Next appointment due Nov 19th -Dec 3rd.

## 2023-10-23 ENCOUNTER — Ambulatory Visit: Payer: BC Managed Care – PPO | Admitting: Emergency Medicine

## 2023-10-23 VITALS — BP 117/69 | HR 70 | Wt 137.6 lb

## 2023-10-23 DIAGNOSIS — Z3042 Encounter for surveillance of injectable contraceptive: Secondary | ICD-10-CM | POA: Diagnosis not present

## 2023-10-23 MED ORDER — MEDROXYPROGESTERONE ACETATE 150 MG/ML IM SUSP
150.0000 mg | Freq: Once | INTRAMUSCULAR | Status: AC
  Administered 2023-10-23: 150 mg via INTRAMUSCULAR

## 2023-10-23 NOTE — Progress Notes (Signed)
Date last pap: 04/18/2023. Last Depo-Provera: 07/31/2023. Side Effects if any: NA. Serum HCG indicated? NA. Depo-Provera 150 mg IM given by: Resa Miner, RN into RD, tolerated well in office. Next appointment due Feb 11th- Feb 25th.

## 2024-01-14 ENCOUNTER — Ambulatory Visit (INDEPENDENT_AMBULATORY_CARE_PROVIDER_SITE_OTHER): Payer: BC Managed Care – PPO

## 2024-01-14 VITALS — BP 107/74 | HR 80

## 2024-01-14 DIAGNOSIS — Z30013 Encounter for initial prescription of injectable contraceptive: Secondary | ICD-10-CM

## 2024-01-14 DIAGNOSIS — Z3042 Encounter for surveillance of injectable contraceptive: Secondary | ICD-10-CM

## 2024-01-14 MED ORDER — MEDROXYPROGESTERONE ACETATE 150 MG/ML IM SUSP
150.0000 mg | Freq: Once | INTRAMUSCULAR | Status: AC
  Administered 2024-01-14: 150 mg via INTRAMUSCULAR

## 2024-01-14 NOTE — Progress Notes (Signed)
Date last pap: 04/18/23. Last Depo-Provera: 10/23/23. Side Effects if any: NA. Serum HCG indicated? NA. Depo-Provera 150 mg IM given by: Karma Ganja, RN. Next appointment due May 5th-May19th 2025.

## 2024-04-07 ENCOUNTER — Ambulatory Visit: Payer: BC Managed Care – PPO

## 2024-04-07 VITALS — BP 102/67 | HR 80

## 2024-04-07 DIAGNOSIS — Z3042 Encounter for surveillance of injectable contraceptive: Secondary | ICD-10-CM | POA: Diagnosis not present

## 2024-04-07 MED ORDER — MEDROXYPROGESTERONE ACETATE 150 MG/ML IM SUSP
150.0000 mg | Freq: Once | INTRAMUSCULAR | Status: AC
  Administered 2024-04-07: 150 mg via INTRAMUSCULAR

## 2024-04-07 NOTE — Progress Notes (Signed)
 Date last pap: 04/18/23. Last Depo-Provera : 01/14/24. Side Effects if any: NA. Serum HCG indicated? NA. Depo-Provera  150 mg IM given by: Artemio Bilberry, RN. Next appointment due July 28 - August 11.

## 2024-04-07 NOTE — Addendum Note (Signed)
 Addended by: Ina Manas on: 04/07/2024 08:34 AM   Modules accepted: Orders

## 2024-06-18 ENCOUNTER — Other Ambulatory Visit: Payer: Self-pay | Admitting: Obstetrics and Gynecology

## 2024-06-18 DIAGNOSIS — Z3049 Encounter for surveillance of other contraceptives: Secondary | ICD-10-CM

## 2024-06-30 ENCOUNTER — Ambulatory Visit

## 2024-06-30 VITALS — BP 106/72 | HR 80

## 2024-06-30 DIAGNOSIS — Z3042 Encounter for surveillance of injectable contraceptive: Secondary | ICD-10-CM

## 2024-06-30 MED ORDER — MEDROXYPROGESTERONE ACETATE 150 MG/ML IM SUSP
150.0000 mg | Freq: Once | INTRAMUSCULAR | Status: AC
Start: 2024-06-30 — End: 2024-06-30
  Administered 2024-06-30: 150 mg via INTRAMUSCULAR

## 2024-06-30 NOTE — Progress Notes (Addendum)
  Date last PAP: 04/18/2023 LSIL. Last Depo-Provera : 04/07/24. Side Effects if any: NONE. Serum HCG indicated? NA.  Depo-Provera  150 mg IM given by: Niels Boning. in RD, tolerated well.  Next appointment due Oct. 20 - Nov. 3, 2025    Administrations This Visit     medroxyPROGESTERone  (DEPO-PROVERA ) injection 150 mg     Admin Date 06/30/2024 Action Given Dose 150 mg Route Intramuscular Documented By Burnard Niels PARAS, RMA

## 2024-09-11 ENCOUNTER — Other Ambulatory Visit: Payer: Self-pay | Admitting: Obstetrics and Gynecology

## 2024-09-11 DIAGNOSIS — Z3049 Encounter for surveillance of other contraceptives: Secondary | ICD-10-CM

## 2024-09-17 ENCOUNTER — Ambulatory Visit

## 2024-09-17 VITALS — BP 112/69 | HR 68

## 2024-09-17 DIAGNOSIS — Z3042 Encounter for surveillance of injectable contraceptive: Secondary | ICD-10-CM | POA: Diagnosis not present

## 2024-09-17 NOTE — Progress Notes (Signed)
 Pt is in the office for depo injection. Administered in LD and pt tolerated well. Next due Jan 7-21 .SABRA Administrations This Visit     medroxyPROGESTERone  (DEPO-PROVERA ) injection 150 mg     Admin Date 09/17/2024 Action Given Dose 150 mg Route Intramuscular Documented By Doneta Laymon BIRCH, RN

## 2024-12-05 ENCOUNTER — Ambulatory Visit

## 2024-12-15 ENCOUNTER — Ambulatory Visit: Payer: Self-pay | Admitting: Obstetrics and Gynecology

## 2024-12-15 ENCOUNTER — Encounter: Payer: Self-pay | Admitting: Obstetrics and Gynecology

## 2024-12-15 VITALS — BP 109/69 | HR 78 | Ht 63.0 in | Wt 135.8 lb

## 2024-12-15 DIAGNOSIS — Z3202 Encounter for pregnancy test, result negative: Secondary | ICD-10-CM

## 2024-12-15 DIAGNOSIS — Z3009 Encounter for other general counseling and advice on contraception: Secondary | ICD-10-CM | POA: Diagnosis not present

## 2024-12-15 DIAGNOSIS — Z30011 Encounter for initial prescription of contraceptive pills: Secondary | ICD-10-CM

## 2024-12-15 LAB — POCT URINE PREGNANCY: Preg Test, Ur: NEGATIVE

## 2024-12-15 MED ORDER — NORETHINDRONE 0.35 MG PO TABS: 1.0000 | ORAL_TABLET | Freq: Every day | ORAL | 3 refills | Status: AC

## 2024-12-15 NOTE — Progress Notes (Signed)
 Pt wishes to switch birth control. Was on depo for 6 years. Considering mini pill.

## 2024-12-15 NOTE — Progress Notes (Signed)
" ° °  GYNECOLOGY PROGRESS NOTE  History:  24 y.o. G0P0000 presents to [facility] for birth contorl. Was on depo for 6 years. The last year on it did not like how she felt on it. Last dose in October. Has had one period since coming off of it. Has had IUD in past but bled everyday. Interested in minipill   The following portions of the patient's history were reviewed and updated as appropriate: allergies, current medications, past family history, past medical history, past social history, past surgical history and problem list. Last pap smear on 04/18/23 LSIL, colpo  CIN 1 05/2023  Health Maintenance Due  Topic Date Due   HPV VACCINES (1 - 3-dose series) Never done   DTaP/Tdap/Td (1 - Tdap) Never done   Hepatitis B Vaccines 19-59 Average Risk (1 of 3 - 19+ 3-dose series) Never done   Influenza Vaccine  Never done   COVID-19 Vaccine (1 - 2025-26 season) Never done     Review of Systems:  Pertinent items are noted in HPI.   Objective:  Physical Exam Blood pressure 109/69, pulse 78, height 5' 3 (1.6 m), weight 135 lb 12.8 oz (61.6 kg), last menstrual period 11/20/2024. VS reviewed, nursing note reviewed,  Constitutional: well developed, well nourished, no distress HEENT: normocephalic Pulm/chest wall: normal effort Neuro: alert and oriented  Skin: warm, dry Psych: affect normal Pelvic exam: deferred  Assessment & Plan:  1. Birth control counseling (Primary)  - POCT urine pregnancy  2. Encounter for initial prescription of contraceptive pills Discussed birth control pill, desires ;minipill. Discussed initiation, missed pill window UPT neg    Plan schedule repeat pap smear   Nidia Daring, FNP  "

## 2025-02-02 ENCOUNTER — Ambulatory Visit: Payer: Self-pay | Admitting: Obstetrics and Gynecology

## 2025-02-04 ENCOUNTER — Ambulatory Visit: Admitting: Physician Assistant
